# Patient Record
Sex: Female | Born: 1975 | Race: White | Hispanic: No | Marital: Single | Smoking: Never smoker
Health system: Southern US, Community
[De-identification: ages and names within clinical notes are randomized; demographics above are authoritative.]

---

## 2006-08-07 ENCOUNTER — Encounter (INDEPENDENT_AMBULATORY_CARE_PROVIDER_SITE_OTHER): Payer: Self-pay | Admitting: General Surgery

## 2006-08-07 ENCOUNTER — Inpatient Hospital Stay (HOSPITAL_COMMUNITY): Admission: EM | Admit: 2006-08-07 | Discharge: 2006-08-08 | Payer: Self-pay | Admitting: Emergency Medicine

## 2010-06-29 NOTE — Op Note (Signed)
Alexandra Serrano, Alexandra Serrano               ACCOUNT NO.:  [id number]   MEDICAL RECORD NO.:  [id number]          PATIENT TYPE:  INP   LOCATION:  5731                         FACILITY:  [facility]   PHYSICIAN:  Cherylynn Ridges, M.D.    DATE OF BIRTH:  1975-09-10   DATE OF PROCEDURE:  08/07/2006  DATE OF DISCHARGE:                               OPERATIVE REPORT   PREOPERATIVE DIAGNOSIS:  Acute appendicitis.   POSTOPERATIVE DIAGNOSIS:  Acute appendicitis.   PROCEDURE:  Laparoscopic appendectomy.   SURGEON:  Cherylynn Ridges, M.D.   ASSISTANT:  None.   ANESTHESIA:  General endotracheal.   ESTIMATED BLOOD LOSS:  Less than 20 mL.   COMPLICATIONS:  None.   CONDITION:  Stable.   The specimen was the appendix.   INDICATIONS FOR OPERATION:  The patient is a 34 year old with abdominal  pain for just over 24 hours localized to the right lower quadrant.  She  had a normal white count but a CT demonstrating acute appendicitis.  She  was taken to the operating room for an appendectomy.   FINDINGS:  The patient had early acute appendicitis with no evidence of  perforation.   OPERATION:  The patient was taken to the operating room, placed on table  in supine position.  After an adequate endotracheal anesthetic was  administered, she was prepped and draped in usual sterile manner  exposing the midline and right upper quadrant.   A supraumbilical curvilinear incision was made using a #11 blade and  taken down to the midline fascia.  The fascia was nicked and then  subsequently grasped with Kocher clamps and we bluntly dissected into  the peritoneal cavity through the preperitoneal space.  The pursestring  suture of 0-0 Vicryl was passed around the peritoneal opening and the  fascial opening and then a Hassan cannula passed into the peritoneal  cavity and secured in place with the pursestring suture.   We insufflated carbon dioxide gas up to maximal intra-abdominal pressure  of 10 mmHg through the  [facility] cannula.  We then passed a right upper  quadrant 5-mm cannula and a suprapubic of 12 mm cannula under direct  vision into the peritoneal cavity.  Once they were all in place, the  patient was placed in Trendelenburg left-side was tilted down.   The appendix could be seen inflamed and thickened in the right lower  quadrant.  We grabbed at its base and then made a window between the  mesoappendix and the base of the cecum.  A 3.5-mm blue Endo-GIA was  passed across the base the appendix and fired.  This detached appendix  from the cecum.  We then dissected out the mesoappendix and came across  it with a 2.5-mm closure white Endo-GIA.  This was fired and there was  subsequent good hemostasis.  We irrigated with saline solution up to  just over a liter.  We aspirated all fluid and gas.  We aspirated above  the liver all fluid and gas and as we removed all instruments, we  removed all fluid and gas.   The supraumbilical fascial site  was closed using a pursestring suture  which was in place.  We closed skin at all sites using running  subcuticular stitch of 5-0 Vicryl.  Each site was injected with 0.25%  Marcaine with epinephrine.  Sterile dressings were applied.  All counts  were correct.      Cherylynn Ridges, M.D.  Electronically Signed     JOW/MEDQ  D:  08/07/2006  T:  08/07/2006  Job:  [id number]

## 2010-06-29 NOTE — H&P (Signed)
Alexandra Serrano, Alexandra Serrano               ACCOUNT NO.:  [id number]   MEDICAL RECORD NO.:  [id number]          PATIENT TYPE:  INP   LOCATION:  5731                         FACILITY:  [facility]   PHYSICIAN:  Cherylynn Ridges, M.D.    DATE OF BIRTH:  31-Oct-1975   DATE OF ADMISSION:  08/06/2006  DATE OF DISCHARGE:                              HISTORY & PHYSICAL   CHIEF COMPLAINT:  The patient is a 35 year old with acute appendicitis  by CT who comes in for an appendectomy.   HISTORY OF PRESENT ILLNESS:  The patient started getting ill yesterday  evening about 9 p.m. to 9:30 p.m.  She had some generalized abdominal  discomfort, some nausea, went to bed, but the pain worsened in spite of  some attempts to treat it with antacids and gas pills.  It worsened to  where she was awakened throughout the night and came into the emergency  room today with continued abdominal pain after being seen in an Urgent  Care office where she was found to have a mid range white blood cell  count, but a possible left shift.  CT scan here demonstrated acute  appendicitis by CT findings and a surgical consultation was obtained.   PAST MEDICAL HISTORY:  Her past medical history is very remarkable.  She  has no history of cardiac, renal, pulmonary or liver disease.  She is  not diabetic.   MEDICATIONS:  Birth control pills only.   ALLERGIES:  She is intolerant of CODEINE.   REVIEW OF SYSTEMS:  She had normal bowel movements and nausea.  Appetite  is suppressed.  She has no blood in her stools.  She does have a history  of a mitral valve prolapse for which she takes antibiotics prior to any  invasive procedure.   PHYSICAL EXAMINATION:  VITAL SIGNS:  She is currently afebrile.  Vital  signs are stable.  HEENT:  She is normocephalic and atraumatic.  NECK:  Neck is supple.  She has no carotid bruits.  LUNGS:  Lungs are clear to auscultation.  CARDIAC:  Regular rhythm and rate with a slight, very short systolic  murmur at  the left lower sternal border.  ABDOMEN:  Soft, tender in the right lower quadrant.  No Rovsing's sign.  No rebound or guarding.  She does have active bowel sounds.  RECTAL/PELVIC:  Not performed.   LABORATORY DATA AND X-RAY FINDINGS:  I reviewed her CBC.  She has a  white count of 8.8 with a very mild, left shift.  Her electrolytes are  within normal limits.   I reviewed her CT scan which demonstrates a tubular thickened structure  in the right lower quadrant heading out laterally, but does not appear  to be retrocecal.   IMPRESSION:  Acute appendicitis without perforation.   PLAN:  The plan is to perform a laparoscopic appendectomy under general  anesthesia.  We have explained this to the patient, the risks and  benefits including the possibility that an open procedure may be  necessary.  The patient understands and wishes to proceed.      Fayrene Fearing  Charlsie Quest, M.D.  Electronically Signed     JOW/MEDQ  D:  08/07/2006  T:  08/07/2006  Job:  [id number]

## 2010-07-02 NOTE — Discharge Summary (Signed)
Alexandra Serrano, BRUTUS               ACCOUNT NO.:  [id number]   MEDICAL RECORD NO.:  [id number]          PATIENT TYPE:  INP   LOCATION:  5731                         FACILITY:  [facility]   PHYSICIAN:  Cherylynn Ridges, M.D.    DATE OF BIRTH:  [birth year]   DATE OF ADMISSION:  08/06/2006  DATE OF DISCHARGE:  08/08/2006                               DISCHARGE SUMMARY   DISCHARGE DIAGNOSIS:  Acute appendicitis.   PRINCIPAL PROCEDURE:  Laparoscopic appendectomy.   DISCHARGE MEDICATIONS:  She was discharged to home in care of her family  with Vicodin 1-2 tablets every 4 hours as needed for pain.   FOLLOWUP:  To see Dr. Lindie Spruce in 2-3 weeks.   CONDITION ON DISCHARGE:  Stable.   BRIEF SUMMARY OF THE HOSPITAL COURSE:  The patient was admitted the day  of surgery, which was August 07, 2006, at which time she underwent a  laparoscopic appendectomy for an acute appendicitis.  Pathology  confirmed the clinical diagnosis and the CT diagnosis.  On post-op day  #1 on August 08, 2006, she was seen by the [facility], and discharged home after advancing to a regular diet.  She is  to follow up to see Dr. Lindie Spruce in 2-3 weeks.      Cherylynn Ridges, M.D.  Electronically Signed     JOW/MEDQ  D:  09/20/2006  T:  09/21/2006  Job:  [id number]

## 2010-07-28 ENCOUNTER — Other Ambulatory Visit: Payer: Self-pay | Admitting: Certified Nurse Midwife

## 2010-12-01 LAB — I-STAT 8, (EC8 V) (CONVERTED LAB)
Acid-base deficit: 1
Chloride: 104
pCO2, Ven: 44 — ABNORMAL LOW
pH, Ven: 7.359 — ABNORMAL HIGH

## 2010-12-01 LAB — POCT PREGNANCY, URINE: Operator id: 277751

## 2010-12-01 LAB — POCT I-STAT CREATININE
Creatinine, Ser: 0.8
Operator id: 277751

## 2015-01-02 ENCOUNTER — Other Ambulatory Visit: Payer: Self-pay

## 2015-01-02 DIAGNOSIS — Z1231 Encounter for screening mammogram for malignant neoplasm of breast: Secondary | ICD-10-CM

## 2015-01-23 ENCOUNTER — Ambulatory Visit: Payer: Self-pay

## 2015-01-27 ENCOUNTER — Other Ambulatory Visit: Payer: Self-pay | Admitting: Certified Nurse Midwife

## 2015-01-27 DIAGNOSIS — R928 Other abnormal and inconclusive findings on diagnostic imaging of breast: Secondary | ICD-10-CM

## 2015-01-29 ENCOUNTER — Ambulatory Visit
Admission: RE | Admit: 2015-01-29 | Discharge: 2015-01-29 | Disposition: A | Discharge status: Home / Self Care | Payer: Managed Care, Other (non HMO) | Source: Ambulatory Visit | Attending: Certified Nurse Midwife | Admitting: Certified Nurse Midwife

## 2015-01-29 DIAGNOSIS — R928 Other abnormal and inconclusive findings on diagnostic imaging of breast: Secondary | ICD-10-CM

## 2015-07-07 ENCOUNTER — Other Ambulatory Visit: Payer: Self-pay | Admitting: Certified Nurse Midwife

## 2015-07-07 DIAGNOSIS — N6320 Unspecified lump in the left breast, unspecified quadrant: Secondary | ICD-10-CM

## 2015-07-31 ENCOUNTER — Ambulatory Visit
Admission: RE | Admit: 2015-07-31 | Discharge: 2015-07-31 | Disposition: A | Discharge status: Home / Self Care | Payer: Managed Care, Other (non HMO) | Source: Ambulatory Visit | Attending: Certified Nurse Midwife | Admitting: Certified Nurse Midwife

## 2015-07-31 ENCOUNTER — Other Ambulatory Visit: Payer: Self-pay | Admitting: Certified Nurse Midwife

## 2015-07-31 DIAGNOSIS — N6320 Unspecified lump in the left breast, unspecified quadrant: Secondary | ICD-10-CM

## 2015-08-26 ENCOUNTER — Other Ambulatory Visit: Payer: Self-pay | Admitting: Certified Nurse Midwife

## 2015-08-26 DIAGNOSIS — N6320 Unspecified lump in the left breast, unspecified quadrant: Secondary | ICD-10-CM

## 2015-08-27 ENCOUNTER — Ambulatory Visit
Admission: RE | Admit: 2015-08-27 | Discharge: 2015-08-27 | Disposition: A | Discharge status: Home / Self Care | Payer: Managed Care, Other (non HMO) | Source: Ambulatory Visit | Attending: Certified Nurse Midwife | Admitting: Certified Nurse Midwife

## 2015-08-27 DIAGNOSIS — N6320 Unspecified lump in the left breast, unspecified quadrant: Secondary | ICD-10-CM

## 2016-02-01 ENCOUNTER — Other Ambulatory Visit: Payer: Self-pay | Admitting: Certified Nurse Midwife

## 2016-02-01 ENCOUNTER — Other Ambulatory Visit: Payer: Self-pay

## 2016-02-01 DIAGNOSIS — N63 Unspecified lump in unspecified breast: Secondary | ICD-10-CM

## 2016-02-01 DIAGNOSIS — Z1231 Encounter for screening mammogram for malignant neoplasm of breast: Secondary | ICD-10-CM

## 2016-02-10 ENCOUNTER — Other Ambulatory Visit: Payer: Self-pay | Admitting: Certified Nurse Midwife

## 2016-02-10 DIAGNOSIS — D242 Benign neoplasm of left breast: Secondary | ICD-10-CM

## 2016-02-10 DIAGNOSIS — N63 Unspecified lump in unspecified breast: Secondary | ICD-10-CM

## 2016-02-11 ENCOUNTER — Ambulatory Visit
Admission: RE | Admit: 2016-02-11 | Discharge: 2016-02-11 | Disposition: A | Discharge status: Home / Self Care | Payer: Managed Care, Other (non HMO) | Source: Ambulatory Visit

## 2016-02-11 ENCOUNTER — Ambulatory Visit
Admission: RE | Admit: 2016-02-11 | Discharge: 2016-02-11 | Disposition: A | Discharge status: Home / Self Care | Payer: Managed Care, Other (non HMO) | Source: Ambulatory Visit | Attending: Certified Nurse Midwife | Admitting: Certified Nurse Midwife

## 2016-02-11 DIAGNOSIS — D242 Benign neoplasm of left breast: Secondary | ICD-10-CM

## 2016-02-11 DIAGNOSIS — N63 Unspecified lump in unspecified breast: Secondary | ICD-10-CM

## 2017-01-30 ENCOUNTER — Other Ambulatory Visit: Payer: Self-pay | Admitting: Certified Nurse Midwife

## 2017-01-30 DIAGNOSIS — Z1231 Encounter for screening mammogram for malignant neoplasm of breast: Secondary | ICD-10-CM

## 2017-03-01 ENCOUNTER — Encounter (INDEPENDENT_AMBULATORY_CARE_PROVIDER_SITE_OTHER): Payer: Self-pay

## 2017-03-01 ENCOUNTER — Ambulatory Visit
Admission: RE | Admit: 2017-03-01 | Discharge: 2017-03-01 | Disposition: A | Discharge status: Home / Self Care | Payer: Managed Care, Other (non HMO) | Source: Ambulatory Visit | Attending: Certified Nurse Midwife | Admitting: Certified Nurse Midwife

## 2017-03-01 DIAGNOSIS — Z1231 Encounter for screening mammogram for malignant neoplasm of breast: Secondary | ICD-10-CM

## 2018-01-09 ENCOUNTER — Ambulatory Visit (INDEPENDENT_AMBULATORY_CARE_PROVIDER_SITE_OTHER): Payer: Managed Care, Other (non HMO) | Admitting: Podiatry

## 2018-01-09 ENCOUNTER — Encounter: Payer: Self-pay | Admitting: Podiatry

## 2018-01-09 ENCOUNTER — Ambulatory Visit (INDEPENDENT_AMBULATORY_CARE_PROVIDER_SITE_OTHER): Payer: Managed Care, Other (non HMO)

## 2018-01-09 ENCOUNTER — Ambulatory Visit: Payer: Managed Care, Other (non HMO) | Admitting: Orthotics

## 2018-01-09 VITALS — BP 114/66 | HR 79 | Resp 16

## 2018-01-09 DIAGNOSIS — M722 Plantar fascial fibromatosis: Secondary | ICD-10-CM

## 2018-01-09 NOTE — Progress Notes (Signed)

## 2018-01-10 NOTE — Progress Notes (Signed)
  Subjective:  Patient ID: Alexandra Serrano, female    DOB: 30-Aug-1975,  MRN: [id number] HPI Chief Complaint  Patient presents with  . Foot Pain    Plantar heel bilateral and lateral ankle right - history of PF for years, always worn orthotics, but would like some new ones, last pair was 2016 and doesn't think they are as effective anymore  . New Patient (Initial Visit)    42 y.o. female presents with the above complaint.   ROS: Denies fever chills nausea vomiting muscle aches pains calf pain back pain chest pain shortness of breath.  No past medical history on file.   Current Outpatient Medications:  .  CAMRESE LO 0.1-0.02 & 0.01 MG tablet, , Disp: , Rfl:   No Known Allergies Review of Systems Objective:   Vitals:   01/09/18 0917  BP: 114/66  Pulse: 79  Resp: 16    General: Well developed, nourished, in no acute distress, alert and oriented x3   Dermatological: Skin is warm, dry and supple bilateral. Nails x 10 are well maintained; remaining integument appears unremarkable at this time. There are no open sores, no preulcerative lesions, no rash or signs of infection present.  Vascular: Dorsalis Pedis artery and Posterior Tibial artery pedal pulses are 2/4 bilateral with immedate capillary fill time. Pedal hair growth present. No varicosities and no lower extremity edema present bilateral.   Neruologic: Grossly intact via light touch bilateral. Vibratory intact via tuning fork bilateral. Protective threshold with Semmes Wienstein monofilament intact to all pedal sites bilateral. Patellar and Achilles deep tendon reflexes 2+ bilateral. No Babinski or clonus noted bilateral.   Musculoskeletal: No gross boney pedal deformities bilateral. No pain, crepitus, or limitation noted with foot and ankle range of motion bilateral. Muscular strength 5/5 in all groups tested bilateral.  Gait: Unassisted, Nonantalgic.    Radiographs:  Radiographs taken today demonstrate no acute findings  she does have some spurring on the right dorsal talus but otherwise no significant findings.  She does have some swelling or soft tissue inflammation at the plantar fashion calcaneal insertion sites.  This is consistent with a mild plantar fasciitis  Assessment & Plan:   Assessment: Mild plantar fasciitis with some neuritis overlying the anterior lateral leg.  Plan: Discussed etiology pathology conservative surgical therapies at this point we will prescribe orthotics and I will follow-up with her as needed.     Jailyne Chieffo T. [place], [place]

## 2018-02-01 ENCOUNTER — Ambulatory Visit (INDEPENDENT_AMBULATORY_CARE_PROVIDER_SITE_OTHER): Payer: Managed Care, Other (non HMO) | Admitting: Orthotics

## 2018-02-01 DIAGNOSIS — M722 Plantar fascial fibromatosis: Secondary | ICD-10-CM

## 2018-02-01 NOTE — Progress Notes (Signed)
Patient came in today to pick up custom made foot orthotics.  The goals were accomplished and the patient reported no dissatisfaction with said orthotics.  Patient was advised of breakin period and how to report any issues. 

## 2018-04-10 DIAGNOSIS — M722 Plantar fascial fibromatosis: Secondary | ICD-10-CM

## 2019-01-18 ENCOUNTER — Other Ambulatory Visit: Payer: Self-pay

## 2019-01-18 DIAGNOSIS — Z20822 Contact with and (suspected) exposure to covid-19: Secondary | ICD-10-CM

## 2019-01-19 LAB — NOVEL CORONAVIRUS, NAA: SARS-CoV-2, NAA: NOT DETECTED

## 2019-10-22 ENCOUNTER — Encounter (INDEPENDENT_AMBULATORY_CARE_PROVIDER_SITE_OTHER): Payer: Self-pay | Admitting: Otolaryngology

## 2019-10-22 ENCOUNTER — Other Ambulatory Visit: Payer: Self-pay

## 2019-10-22 ENCOUNTER — Ambulatory Visit (INDEPENDENT_AMBULATORY_CARE_PROVIDER_SITE_OTHER): Payer: 59 | Admitting: Otolaryngology

## 2019-10-22 VITALS — Temp 97.2°F

## 2019-10-22 DIAGNOSIS — H9312 Tinnitus, left ear: Secondary | ICD-10-CM

## 2019-10-22 NOTE — Progress Notes (Signed)
HPI: Alexandra Serrano is a 44 y.o. female who presents for evaluation of left ear tinnitus which has been off-and-on over the past 2 months.  She describes a high-pitched sound in the left ear that has been occurring over the past 2 months but is presently doing a little bit better.  She has not noted any hearing problems.  She has not been exposed to any loud noise exposure. She got her Covid vaccines 3 to 4 months ago.  No past medical history on file.  Social History   Socioeconomic History  . Marital status: Single    Spouse name: Not on file  . Number of children: Not on file  . Years of education: Not on file  . Highest education level: Not on file  Occupational History  . Not on file  Tobacco Use  . Smoking status: Never Smoker  . Smokeless tobacco: Never Used  Substance and Sexual Activity  . Alcohol use: Not on file  . Drug use: Not on file  . Sexual activity: Not on file  Other Topics Concern  . Not on file  Social History Narrative  . Not on file   Social Determinants of Health   Financial Resource Strain:   . Difficulty of Paying Living Expenses: Not on file  Food Insecurity:   . Worried About Charity fundraiser in the Last Year: Not on file  . Ran Out of Food in the Last Year: Not on file  Transportation Needs:   . Lack of Transportation (Medical): Not on file  . Lack of Transportation (Non-Medical): Not on file  Physical Activity:   . Days of Exercise per Week: Not on file  . Minutes of Exercise per Session: Not on file  Stress:   . Feeling of Stress : Not on file  Social Connections:   . Frequency of Communication with Friends and Family: Not on file  . Frequency of Social Gatherings with Friends and Family: Not on file  . Attends Religious Services: Not on file  . Active Member of Clubs or Organizations: Not on file  . Attends Archivist Meetings: Not on file  . Marital Status: Not on file   No family history on file. No Known  Allergies Prior to Admission medications   Medication Sig Start Date End Date Taking? Authorizing Provider  CAMRESE LO 0.1-0.02 & 0.01 MG tablet  10/20/17  Yes [provider]      Positive ROS: Otherwise negative.  All other systems have been reviewed and were otherwise negative with the exception of those mentioned in the HPI and as above.  Physical Exam: Constitutional: Alert, well-appearing, no acute distress Ears: External ears without lesions or tenderness. Ear canals are clear bilaterally with intact, clear TMs.  Auscultation of the ears revealed no objective tinnitus or pulse. Nasal: External nose without lesion. Clear nasal passages Oral: Lips and gums without lesions. Tongue and palate mucosa without lesions. Posterior oropharynx clear. Neck: No palpable adenopathy or masses. Respiratory: Breathing comfortably  Skin: No facial/neck lesions or rash noted.  Audiogram demonstrated normal hearing in both ears with SRT's of 10 dB bilaterally.  She had normal hearing in the upper frequencies in both ears.  She had type A tympanograms bilaterally.  Procedures  Assessment: Left ear tinnitus.  Questionable etiology.  Normal hearing evaluation.  Plan: I discussed with her concerning limited treatment for tinnitus.  Recommended use of masking noise to help cope with the tinnitus when it is bad.  Reviewed with her that her hearing test was normal. Also discussed with her concerning ear protection when around any loud noise. She will follow-up if she notices any change in her hearing or worsening of the tinnitus.  Radene Journey, MD

## 2019-10-24 ENCOUNTER — Encounter (INDEPENDENT_AMBULATORY_CARE_PROVIDER_SITE_OTHER): Payer: Self-pay

## 2020-06-15 ENCOUNTER — Other Ambulatory Visit: Payer: Self-pay | Admitting: Obstetrics and Gynecology

## 2020-06-15 DIAGNOSIS — R928 Other abnormal and inconclusive findings on diagnostic imaging of breast: Secondary | ICD-10-CM

## 2020-07-02 ENCOUNTER — Ambulatory Visit: Payer: 59

## 2020-07-02 ENCOUNTER — Other Ambulatory Visit: Payer: Self-pay

## 2020-07-02 ENCOUNTER — Ambulatory Visit
Admission: RE | Admit: 2020-07-02 | Discharge: 2020-07-02 | Disposition: A | Discharge status: Home / Self Care | Payer: 59 | Source: Ambulatory Visit | Attending: Obstetrics and Gynecology | Admitting: Obstetrics and Gynecology

## 2020-07-02 DIAGNOSIS — R928 Other abnormal and inconclusive findings on diagnostic imaging of breast: Secondary | ICD-10-CM

## 2021-06-23 ENCOUNTER — Other Ambulatory Visit: Payer: Self-pay | Admitting: Obstetrics and Gynecology

## 2021-06-23 DIAGNOSIS — Z1231 Encounter for screening mammogram for malignant neoplasm of breast: Secondary | ICD-10-CM

## 2021-07-05 ENCOUNTER — Ambulatory Visit: Payer: 59

## 2022-09-06 IMAGING — MG MM DIGITAL DIAGNOSTIC UNILAT*L* W/ TOMO W/ CAD
6 series · 6 of 18 positions shown · non-contrast
Comparison: Previous examinations, including the bilateral
screening mammogram obtained at [facility] on
03/03/2020.

CLINICAL DATA: Two possible asymmetries in the outer left breast on
a screening mammogram in February 2020 elsewhere.

EXAM:
DIGITAL DIAGNOSTIC UNILATERAL LEFT MAMMOGRAM WITH TOMOSYNTHESIS AND
CAD
TECHNIQUE: Left digital diagnostic mammography and breast tomosynthesis was
performed. The images were evaluated with computer-aided detection.

[L CC synth-2D (1 of 2)]
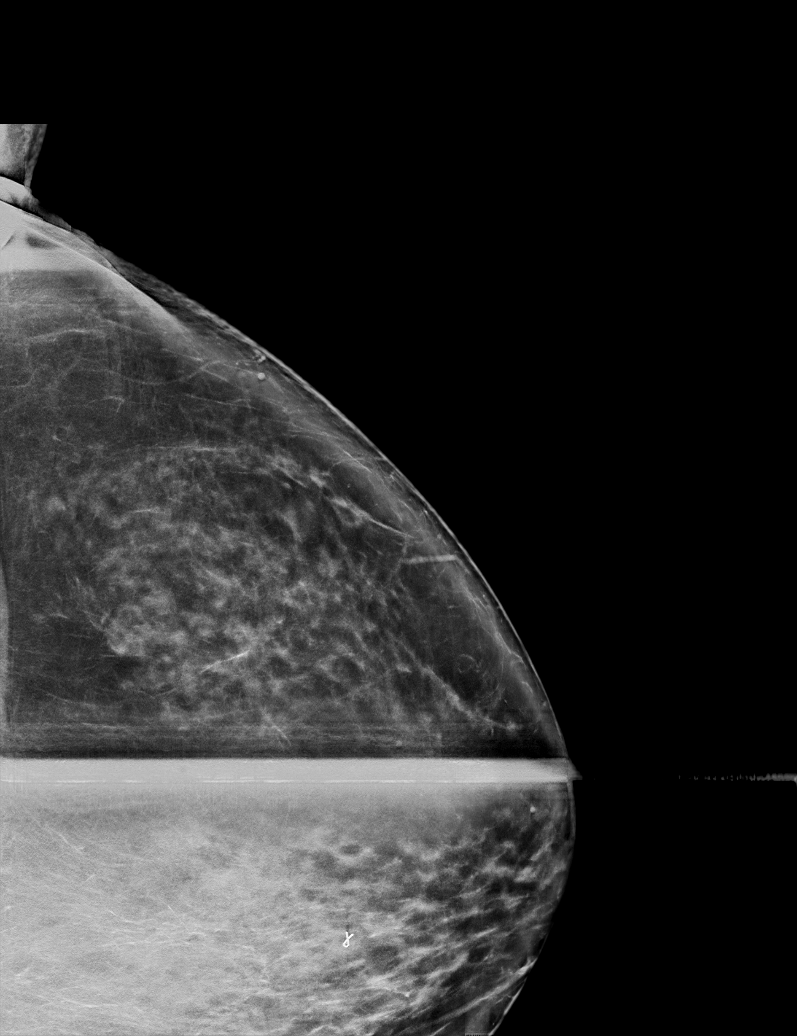

[L CC synth-2D (2 of 2)]
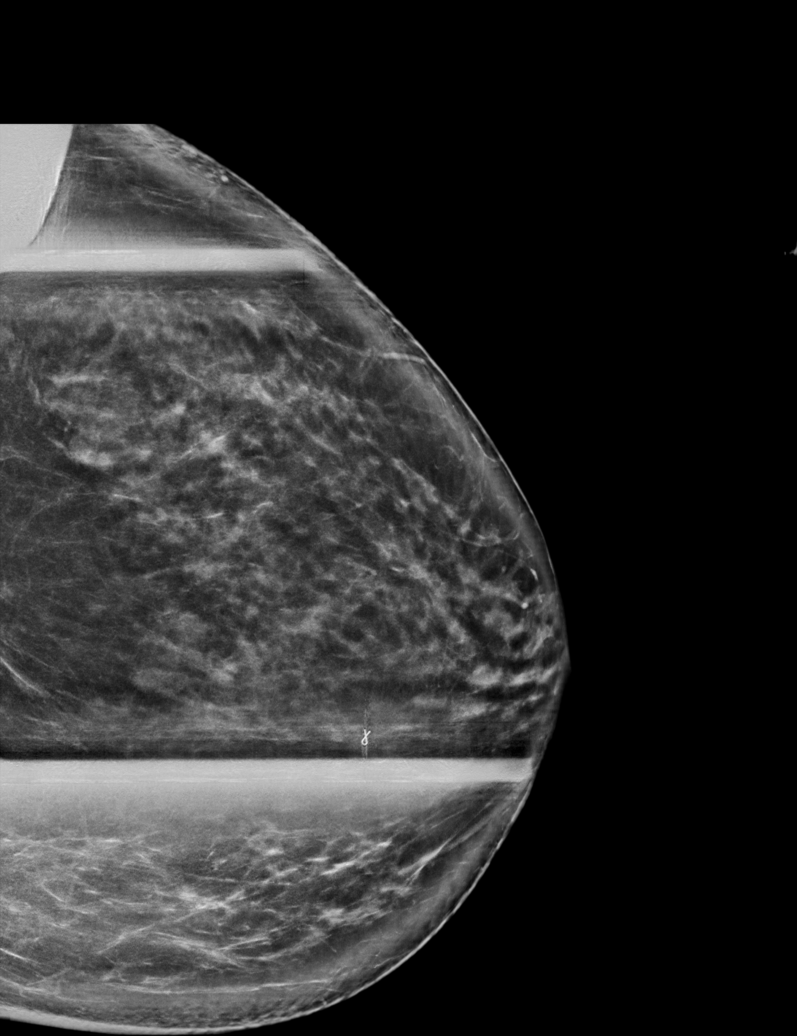

[L MLO synth-2D]
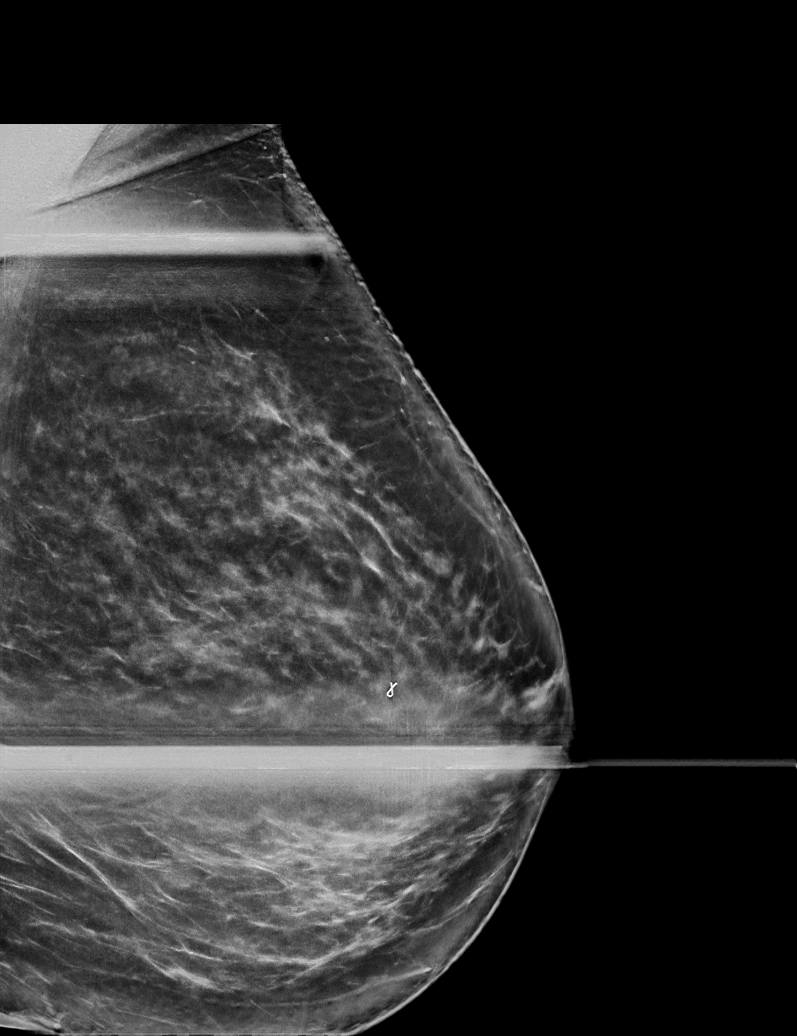

[L CC tomo (1 of 2) · tomo slice 47/92.0]
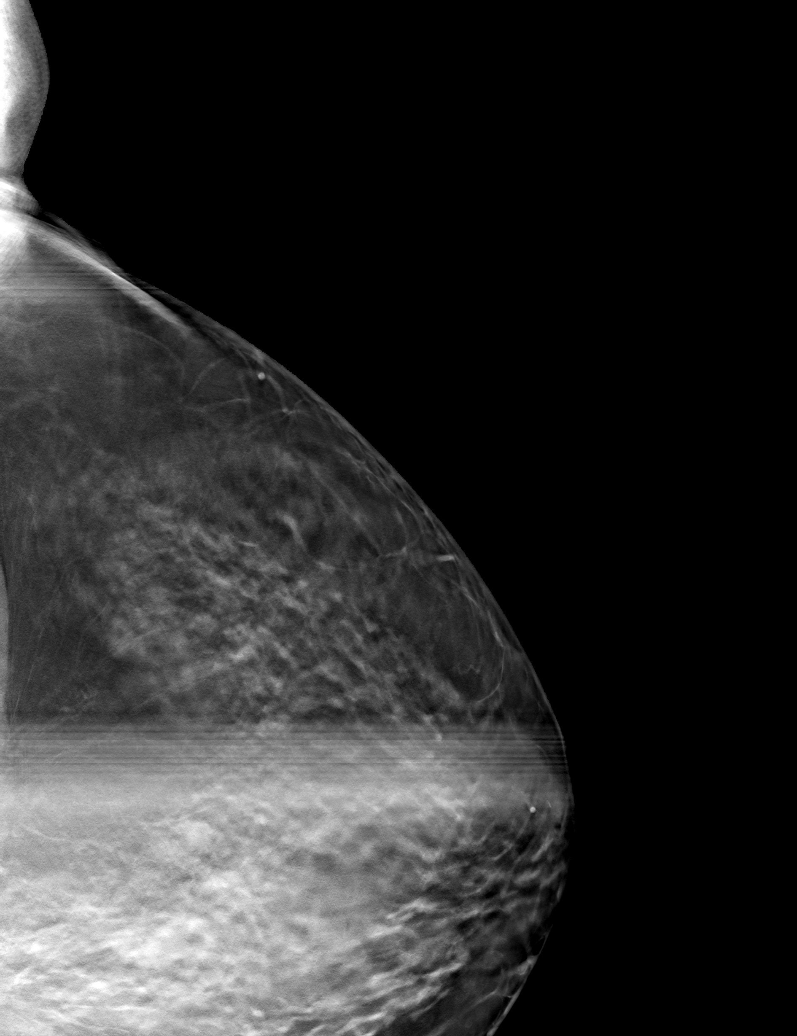

[L CC tomo (2 of 2) · tomo slice 46/91.0]
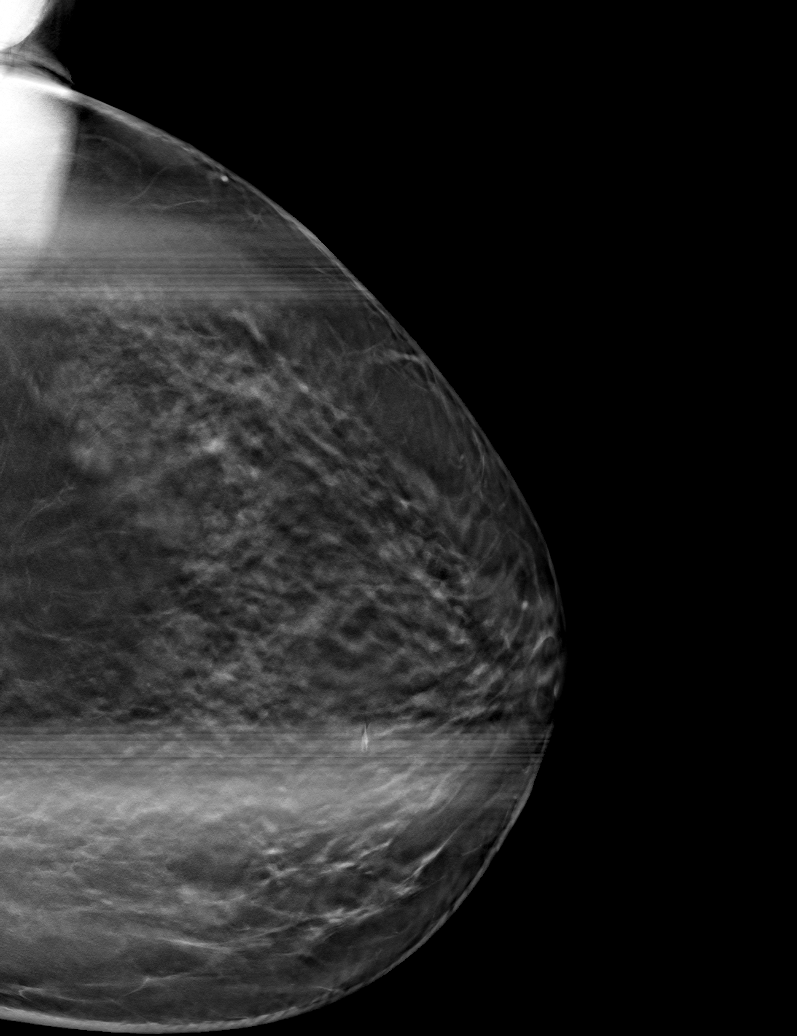

[L MLO tomo · tomo slice 47/93.0]
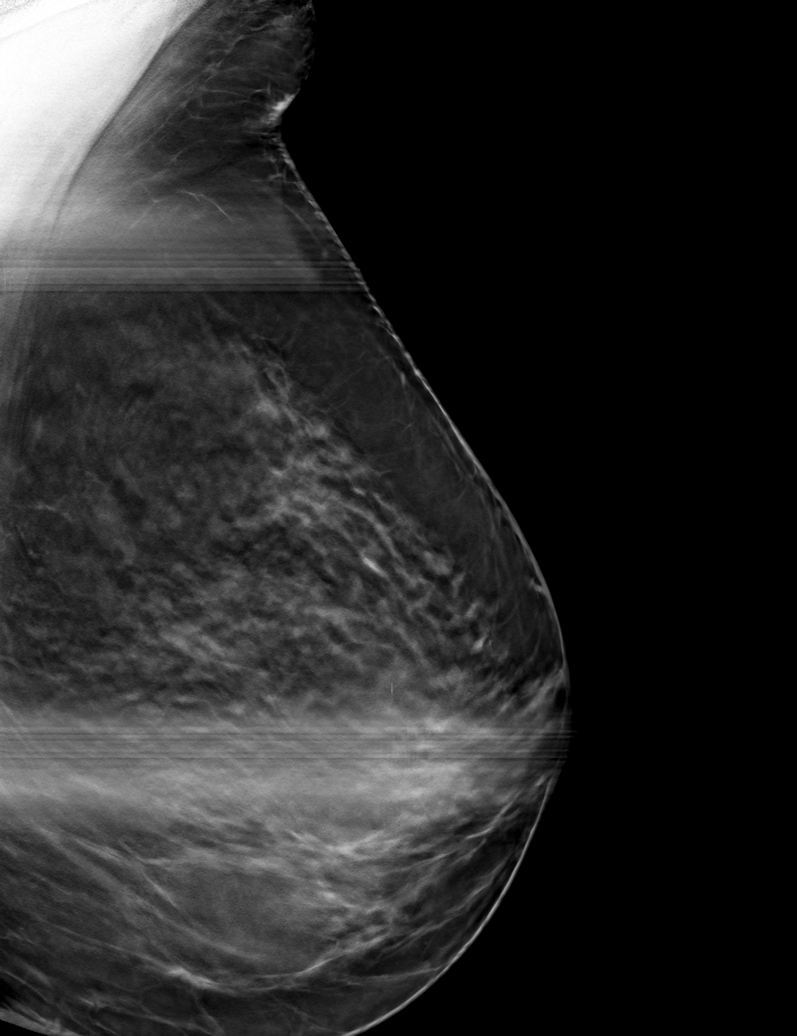

[6 of 18 positions shown; findings below may reference images not displayed]

ACR Breast Density Category c: The breast tissue is heterogeneously
dense, which may obscure small masses.
FINDINGS: 3D tomographic and 2D generated spot compression images of the left
breast demonstrate normal appearing fibroglandular tissue at the
locations of the recently suspected asymmetries, unchanged compared
to previous examinations.
IMPRESSION: No evidence of malignancy. The recently suspected left breast
asymmetries were areas of close apposition of normal breast tissue.

RECOMMENDATION:
Bilateral screening mammogram in 8 months when due.

I have discussed the findings and recommendations with the patient.
If applicable, a reminder letter will be sent to the patient
regarding the next appointment.

BI-RADS CATEGORY  1: Negative.

## 2023-06-20 ENCOUNTER — Ambulatory Visit: Admitting: Podiatry

## 2023-06-20 ENCOUNTER — Encounter: Payer: Self-pay | Admitting: Podiatry

## 2023-06-20 DIAGNOSIS — M7751 Other enthesopathy of right foot: Secondary | ICD-10-CM

## 2023-06-20 DIAGNOSIS — M7752 Other enthesopathy of left foot: Secondary | ICD-10-CM | POA: Diagnosis not present

## 2023-06-20 DIAGNOSIS — M722 Plantar fascial fibromatosis: Secondary | ICD-10-CM | POA: Diagnosis not present

## 2023-06-20 NOTE — Progress Notes (Signed)
 Orthotics   Patient was present and evaluated for Custom molded foot orthotics. Patient will benefit from CFO's to provide total contact to BIL MLA's helping to balance and distribute body weight more evenly across BIL feet helping to reduce plantar pressure and pain. Orthotic will also encourage FF / RF alignment  Patient was scanned today and will return for fitting upon receipt  Delivery appt set for 5/28 1:45 pm delivery  Britton Cane CPed, CFo, CFm  Financial ppw signed

## 2023-06-20 NOTE — Patient Instructions (Signed)

## 2023-06-20 NOTE — Progress Notes (Signed)
 She presents today after 5 years.  She has been through surgery on her right knee she has a hip issue is seeing chiropractor feels that she is walking differently on her right foot still has some numbness sensation beneath her toes bilaterally.  Objective: Vital signs are stable alert and oriented x 3.  Pulses are palpable.  Neurologic sensorium is intact.  Deep tendon reflexes are intact.  Muscle strength is normal and symmetrical.  She has some tenderness on palpation of the plantar fascial calcaneal insertion sites bilaterally.  Assessment: History of plantar fasciitis extremity pain resulting in abnormal gait right.  Plan: She was casted today for custom orthotics.

## 2023-07-12 ENCOUNTER — Ambulatory Visit

## 2023-07-12 NOTE — Progress Notes (Signed)
Patient presents today to pick up custom molded foot orthotics, diagnosed with PF by Dr. Al Corpus .   Orthotics were dispensed and fit was satisfactory. Reviewed instructions for break-in and wear. Written instructions given to patient.  Patient will follow up as needed.   Addison Bailey Cped, CFo, CFm
